# Patient Record
Sex: Male | Born: 1965 | Race: White | Hispanic: No | Marital: Single | Smoking: Current every day smoker
Health system: Southern US, Community
[De-identification: ages and names within clinical notes are randomized; demographics above are authoritative.]

## PROBLEM LIST (undated history)

## (undated) HISTORY — PX: NECK SURGERY: SHX720

---

## 2007-02-27 ENCOUNTER — Ambulatory Visit (HOSPITAL_COMMUNITY): Admission: RE | Admit: 2007-02-27 | Discharge: 2007-02-28 | Payer: Self-pay | Admitting: Neurosurgery

## 2008-07-05 ENCOUNTER — Observation Stay (HOSPITAL_COMMUNITY): Admission: RE | Admit: 2008-07-05 | Discharge: 2008-07-06 | Payer: Self-pay | Admitting: Neurosurgery

## 2008-08-06 ENCOUNTER — Encounter: Admission: RE | Admit: 2008-08-06 | Discharge: 2008-08-06 | Payer: Self-pay | Admitting: Neurosurgery

## 2008-09-27 ENCOUNTER — Encounter: Admission: RE | Admit: 2008-09-27 | Discharge: 2008-09-27 | Payer: Self-pay | Admitting: Neurosurgery

## 2008-10-29 ENCOUNTER — Encounter: Admission: RE | Admit: 2008-10-29 | Discharge: 2008-10-29 | Payer: Self-pay | Admitting: Neurosurgery

## 2011-03-02 NOTE — Op Note (Signed)
Craig Faulkner, Craig Faulkner                 ACCOUNT NO.:  [id number]   MEDICAL RECORD NO.:  [id number]          PATIENT TYPE:  OBV   LOCATION:  3599                         FACILITY:  [facility]   PHYSICIAN:  Donalee Citrin, M.D.        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   PROCEDURE:  Anterior cervical diskectomy and fusion at C6-7, exploration  of fusion at C5-6, and removal of hardware at C5-6.   SURGEON:  Donalee Citrin, MD   ASSISTANT:  Reinaldo Meeker, MD   ANESTHESIA:  General endotracheal.   HISTORY OF PRESENT ILLNESS:  The patient is a very pleasant 45 year old  gentleman who underwent a C5-6 ACDF about a year and a half ago.  Over  the last several months, the patient had progressive worsening neck and  right arm pain radiating down the first two fingers of his right hand.  MRI scan showed disk herniation at C6-7 causing spinal cord compression  and a right C7 neural foraminal stenosis.  The patient failed all forms  of conservative treatment.  X-rays of his C5-6 fusion appeared to be  solid.  However, the outline of the space was still able to be  visualized even though this did not have any halo around it and the  screws appeared to be solid, however, the patient was recommended an  ACDF at C6-7 and exploration of fusion at C5-6.  The risks and benefits  were explained to the patient.  He understood and agreed to proceed  forward.   PROCEDURE IN DETAIL:  The patient was brought to the OR, was induced  under general anesthesia, positioned supine, the neck flexed in slight  extension in 5 pounds of halter traction.  The right side of his neck  was prepped and draped in the usual sterile fashion.  Preoperative x-ray  confirmed that the old incision would be adequate for  exposure of both  levels and so his old incision was incised.  Scar tissue was dissected  off and the platysma was divided longitudinally and the avascular plane  between the sternocleidomastoid and strap muscle was developed down to  the prevertebral fascia.  Prevertebral fascia and scar were dissected  off of the C6-7 disk space as well as off the C5-6 plate.  The Venture  plate was then visualized.  The screw holes were freed up and the screws  were removed and the plate was removed.  The Bovie electrocautery was  used to clean off the anterior vertebral bodies at C5-6 and the bone  graft.  The anterior margin of the bone graft again appeared to be  solid; however, on x-ray it was decided at that point just to put a 2-  level plate in without actually dislodging the bone graft as it did  appear to be solid.  I did not see any breaks between the anterior  margin of the bone graft in either C5 or C6 vertebral bodies.  An  annulotomy was then made at C6-7.  Disk space was cleaned out at C6-7  with a high-speed drill down the posterior annulus and osteophytic  complex.  Using a 1-mm Kerrison punch, the undersurface of the endplates  were aggressively under bitten.  The PLL was identified and removed in  piecemeal fashion.  Several large fractured disk herniations were  immediately expressed and removed in piecemeal fashion.  Aggressive  under biting of both the C6 and C7 endplates to the level of the C7  pedicles bilaterally was carried out.  Both C7 nerve roots were  identified, and both C7 nerve roots were skeletonized and flushed with a  pedicle.  At the end of the diskectomy, there was no further stenosis in  the central canal or either the C7 nerve root.  There was a fair amount  of soft disk material displacing the spinal cord in right C7 nerve root,  it was all removed in piecemeal fashion.  Then, the endplates were  scraped.  A size 9-mm bone graft was then inserted at C6-7.  A  45-mm  Venture plate was then placed.  Two rescue screws were placed in the  body of C5.  Then, two new holes were drilled in the C7 and two new  holes were also drilled in the C6.  A 15-mm variable angle screws were  placed at C6-C7.  Intraoperative fluoroscopy confirmed good position of  the plate, screws, and bone grafts.  Then, the wound was copiously  irrigated.  Meticulous hemostasis was maintained.  The platysma was  reapproximate with interrupted Vicryl, and the skin was closed with  running 4-0 subcuticular.  Benzoin and Steri-Strips were applied.  The  patient went to the recovery room in stable condition.  At the end of  the case, sponge, needle, and instrument counts were correct.           ______________________________  Donalee Citrin, M.D.     GC/MEDQ  D:  07/05/2008  T:  07/06/2008  Job:  [id number]

## 2011-03-05 NOTE — Op Note (Signed)
NAMEKLAUS, CASTENEDA                 ACCOUNT NO.:  [id number]   MEDICAL RECORD NO.:  [id number]          PATIENT TYPE:  AMB   LOCATION:  SDS                          FACILITY:  [facility]   PHYSICIAN:  Donalee Citrin, M.D.        DATE OF BIRTH:  11/07/1965   DATE OF PROCEDURE:  02/27/2007  DATE OF DISCHARGE:                               OPERATIVE REPORT   PREOPERATIVE DIAGNOSIS:  C6 radiculopathy, right from cervical  spondylosis with ruptured disk at C5-C6.   PROCEDURE:  Intracervical diskectomy and fusion C5-C6 using an 8 mm  allograft wedge and 25 mm Venture plate with four 13-mm screws.   SURGEON:  Donalee Citrin, M.D.   ASSISTANT:  Kathaleen Maser. Pool, M.D.   ANESTHESIA:  General endotracheal.   HISTORY OF PRESENT ILLNESS:  The patient is a very pleasant 45 year old  gentleman who has longstanding neck and right arm pain with trace  weakness of his right triceps.  MRI scan showed severe cervical spinal  stenosis with a ruptured disk causing spinal cord compression, right  foraminal stenosis at C5-C6 level on the C6 nerve root. The patient  failed all conservative treatment.  It was recommended __________ .  The  risks, benefits of the operation were explained to the patient and  consent.  The patient understood and agreed to the procedure.   DESCRIPTION OF PROCEDURE:  The patient was brought to the OR, induced  under general anesthesia and placed supine.  The neck was prepped and  draped in routine sterile fashion.  Preoperative x-ray localized at the  appropriate level and a curvilinear incision was made just off midline  longitudinally.  The __________  prevertebral fascia.  Prevertebral  fascia dissected with Kitners.  Intraoperative x-ray for mobilization of  the appropriate level.  Then the __________  15 blade scalpel __________  retractor was placed.  A large __________  vetebral body was bitten off  with a Kerrison punch and drill and the interspace was drilled down with  a high-speed  drill to the posterior annulus and posterior __________  complexes.  At this point the __________  brought in the field and  __________  drilled down further to the posterior annulus __________  ligament.  Then using 1 mm Kerrison punch the under surface of the  posterior __________  ligament and annulus were removed in piecemeal  fashion exposing the thecal sac.  Then marching from left to right the  undersurface of both end plates were under bitten and decompressed the  canal, there was a  large ruptured disk displaced on the right side of  the spinal cord and right proximal C6 neural foramen.  This was all  removed in a piecemeal fashion with Kerrison punch, exposing the C6  nerve root, flushing the pedicle with C6.  The foramen was then widely  decompressed and the C6 nerve root was explored, noted to be widely  decompressed and the foramen patent.  Then the remainder of the 5  vertebral body was under bitten again, marching across the left C6  pedicle and left C6  nerve root.  After this was adequately decompressed,  the wound was copiously irrigated __________  maintained.  A size 8 mm  graft was inserted after scraping the end plates.  __________  deep to  the anterior vetebral line.  Then a __________  plate measuring 25 mm  was __________  screws were all placed with excellent purchase.  The  locking mechanism was engaged.  The wound was copiously  irrigated with __________  maintained.  The platysmas reapproximated  with interrupted Vicryl and the skin was closed in a running 4-0  subcuticular.  Benzoin and Steri-Strips were applied.  The patient went  to recovery stable.  Sponge and needle count correct.           ______________________________  Donalee Citrin, M.D.     GC/MEDQ  D:  02/27/2007  T:  02/27/2007  Job:  [id number]

## 2011-07-19 LAB — BASIC METABOLIC PANEL
BUN: 14
CO2: 25
Chloride: 102
GFR calc Af Amer: >60
GFR calc non Af Amer: >60
Glucose, Bld: 88
Potassium: 4.6

## 2011-07-19 LAB — CBC
HCT: 47.5
Hemoglobin: 16.3
MCV: 95.4
WBC: 11 — ABNORMAL HIGH

## 2011-07-19 LAB — HEPATIC FUNCTION PANEL
Alkaline Phosphatase: 60
Bilirubin, Direct: 0.2
Total Bilirubin: 1.1
Total Protein: 6.9

## 2014-07-06 ENCOUNTER — Emergency Department (HOSPITAL_COMMUNITY)
Admission: EM | Admit: 2014-07-06 | Discharge: 2014-07-06 | Disposition: A | Discharge status: Home / Self Care | Payer: Self-pay | Attending: Emergency Medicine | Admitting: Emergency Medicine

## 2014-07-06 ENCOUNTER — Encounter (HOSPITAL_COMMUNITY): Payer: Self-pay | Admitting: Emergency Medicine

## 2014-07-06 ENCOUNTER — Emergency Department (HOSPITAL_COMMUNITY): Payer: No Typology Code available for payment source

## 2014-07-06 DIAGNOSIS — F17200 Nicotine dependence, unspecified, uncomplicated: Secondary | ICD-10-CM | POA: Insufficient documentation

## 2014-07-06 DIAGNOSIS — Z9889 Other specified postprocedural states: Secondary | ICD-10-CM | POA: Insufficient documentation

## 2014-07-06 DIAGNOSIS — Y9389 Activity, other specified: Secondary | ICD-10-CM | POA: Insufficient documentation

## 2014-07-06 DIAGNOSIS — S0993XA Unspecified injury of face, initial encounter: Secondary | ICD-10-CM | POA: Insufficient documentation

## 2014-07-06 DIAGNOSIS — Z880 Allergy status to penicillin: Secondary | ICD-10-CM | POA: Insufficient documentation

## 2014-07-06 DIAGNOSIS — S161XXA Strain of muscle, fascia and tendon at neck level, initial encounter: Secondary | ICD-10-CM

## 2014-07-06 DIAGNOSIS — S199XXA Unspecified injury of neck, initial encounter: Secondary | ICD-10-CM

## 2014-07-06 DIAGNOSIS — S139XXA Sprain of joints and ligaments of unspecified parts of neck, initial encounter: Secondary | ICD-10-CM | POA: Insufficient documentation

## 2014-07-06 DIAGNOSIS — Y9289 Other specified places as the place of occurrence of the external cause: Secondary | ICD-10-CM | POA: Insufficient documentation

## 2014-07-06 MED ORDER — OXYCODONE-ACETAMINOPHEN 5-325 MG PO TABS
2.0000 | ORAL_TABLET | Freq: Once | ORAL | Status: AC
  Administered 2014-07-06: 2 via ORAL
  Filled 2014-07-06: qty 2

## 2014-07-06 MED ORDER — OXYCODONE-ACETAMINOPHEN 5-325 MG PO TABS: 1.0000 | ORAL_TABLET | Freq: Four times a day (QID) | ORAL | Status: AC | PRN
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
## 2014-07-06 NOTE — ED Notes (Signed)
Pt placed on monitor. Pt monitored by blood pressure and pulse ox.  

## 2014-07-06 NOTE — Discharge Instructions (Signed)
You have neck pain, possibly from a cervical strain and/or pinched nerve.  ° °SEEK IMMEDIATE MEDICAL ATTENTION IF: °You develop difficulties swallowing or breathing.  °You have new or worse numbness, weakness, tingling, or movement problems in your arms or legs.  °You develop increasing pain which is uncontrolled with medications.  °You have change in bowel or bladder function, or other concerns. ° ° ° °

## 2016-01-05 ENCOUNTER — Encounter (HOSPITAL_COMMUNITY): Payer: Self-pay

## 2016-01-05 ENCOUNTER — Emergency Department (HOSPITAL_COMMUNITY)
Admission: EM | Admit: 2016-01-05 | Discharge: 2016-01-05 | Disposition: A | Discharge status: Home / Self Care | Payer: No Typology Code available for payment source | Attending: Emergency Medicine | Admitting: Emergency Medicine

## 2016-01-05 ENCOUNTER — Emergency Department (HOSPITAL_COMMUNITY): Payer: No Typology Code available for payment source

## 2016-01-05 DIAGNOSIS — R202 Paresthesia of skin: Secondary | ICD-10-CM | POA: Insufficient documentation

## 2016-01-05 DIAGNOSIS — S199XXA Unspecified injury of neck, initial encounter: Secondary | ICD-10-CM | POA: Insufficient documentation

## 2016-01-05 DIAGNOSIS — Y9389 Activity, other specified: Secondary | ICD-10-CM | POA: Insufficient documentation

## 2016-01-05 DIAGNOSIS — S0990XA Unspecified injury of head, initial encounter: Secondary | ICD-10-CM | POA: Insufficient documentation

## 2016-01-05 DIAGNOSIS — F17210 Nicotine dependence, cigarettes, uncomplicated: Secondary | ICD-10-CM | POA: Insufficient documentation

## 2016-01-05 DIAGNOSIS — Y998 Other external cause status: Secondary | ICD-10-CM | POA: Insufficient documentation

## 2016-01-05 DIAGNOSIS — Z5321 Procedure and treatment not carried out due to patient leaving prior to being seen by health care provider: Secondary | ICD-10-CM | POA: Insufficient documentation

## 2016-01-05 DIAGNOSIS — W5519XA Other contact with horse, initial encounter: Secondary | ICD-10-CM | POA: Insufficient documentation

## 2016-01-05 DIAGNOSIS — Y9289 Other specified places as the place of occurrence of the external cause: Secondary | ICD-10-CM | POA: Insufficient documentation

## 2016-01-05 NOTE — ED Notes (Signed)
Pt. Was breaking in a horse and the horse and him both went onto the ground.  The horse fell on top of the pt.  He was okay at the beginning, but now his head is hurting and is having severe neck pain.  Skin is warm and dry.  Pt. Is having tingling  In is shoulders.  He thinks he possibly had LOC.   Skin is warm and dry.  He is unable to move his neck, severe pain if he does.  C-collar applied in Triage.  Denies any other injuries.  Pt. Has had 2 surgeries on his neck.

## 2016-01-07 ENCOUNTER — Encounter (HOSPITAL_COMMUNITY): Payer: Self-pay | Admitting: Emergency Medicine

## 2016-01-07 ENCOUNTER — Emergency Department (HOSPITAL_COMMUNITY)
Admission: EM | Admit: 2016-01-07 | Discharge: 2016-01-07 | Disposition: A | Discharge status: Home / Self Care | Payer: No Typology Code available for payment source | Attending: Emergency Medicine | Admitting: Emergency Medicine

## 2016-01-07 DIAGNOSIS — S161XXA Strain of muscle, fascia and tendon at neck level, initial encounter: Secondary | ICD-10-CM | POA: Insufficient documentation

## 2016-01-07 DIAGNOSIS — Z880 Allergy status to penicillin: Secondary | ICD-10-CM | POA: Insufficient documentation

## 2016-01-07 DIAGNOSIS — F17210 Nicotine dependence, cigarettes, uncomplicated: Secondary | ICD-10-CM | POA: Insufficient documentation

## 2016-01-07 DIAGNOSIS — W1839XA Other fall on same level, initial encounter: Secondary | ICD-10-CM | POA: Insufficient documentation

## 2016-01-07 DIAGNOSIS — W19XXXA Unspecified fall, initial encounter: Secondary | ICD-10-CM

## 2016-01-07 DIAGNOSIS — Y9389 Activity, other specified: Secondary | ICD-10-CM | POA: Insufficient documentation

## 2016-01-07 DIAGNOSIS — Y9289 Other specified places as the place of occurrence of the external cause: Secondary | ICD-10-CM | POA: Insufficient documentation

## 2016-01-07 DIAGNOSIS — Y998 Other external cause status: Secondary | ICD-10-CM | POA: Insufficient documentation

## 2016-01-07 MED ORDER — CYCLOBENZAPRINE HCL 10 MG PO TABS: 10.0000 mg | ORAL_TABLET | Freq: Two times a day (BID) | ORAL | Status: AC | PRN

## 2016-01-07 MED ORDER — METHOCARBAMOL 500 MG PO TABS: 500.0000 mg | ORAL_TABLET | Freq: Two times a day (BID) | ORAL | Status: AC

## 2016-01-07 NOTE — ED Notes (Signed)
Pt. fell from his horse Monday morning , reports pain at posterior neck and upper back ,CT scan of head and c-spine done here at 2 days ago , pain increases with movement unrelieved by OTC pain medications .

## 2016-01-07 NOTE — ED Provider Notes (Signed)
Patient left without being seen. Was notified by nurse in triage that patient had a horse roll ontop of her after hitting her head.  Complaining of head and neck pain.  CT ordered based on history as there was a prolonged wait to be seen. CT negative.   1. Patient left without being seen      Melene Planan Manish Ruggiero, DO 01/07/16 1925

## 2016-01-07 NOTE — Discharge Instructions (Signed)

## 2016-01-07 NOTE — ED Provider Notes (Signed)
CSN: [id number]     Arrival date & time 01/07/16  4098 History   First MD Initiated Contact with Patient 01/07/16 [phone]     Chief Complaint  Patient presents with  . Fall     (Consider location/radiation/quality/duration/timing/severity/associated sxs/prior Treatment) Patient is a 50 y.o. male presenting with fall. The history is provided by the patient. No language interpreter was used.  Fall This is a new problem. The current episode started in the past 7 days. The problem occurs constantly. The problem has been unchanged. Associated symptoms include neck pain. Pertinent negatives include no numbness, visual change or weakness. The symptoms are aggravated by twisting. He has tried acetaminophen and NSAIDs for the symptoms. The treatment provided mild relief.    History reviewed. No pertinent past medical history. Past Surgical History  Procedure Laterality Date  . Neck surgery     No family history on file. Social History  Substance Use Topics  . Smoking status: Current Every Day Smoker    Types: Cigarettes  . Smokeless tobacco: None  . Alcohol Use: No    Review of Systems  Musculoskeletal: Positive for neck pain.  Neurological: Negative for weakness and numbness.  All other systems reviewed and are negative.     Allergies  Codeine and Penicillins  Home Medications   Prior to Admission medications   Medication Sig Start Date End Date Taking? Authorizing Provider  oxyCODONE-acetaminophen (PERCOCET) 5-325 MG per tablet Take 1-2 tablets by mouth every 6 (six) hours as needed for severe pain. 07/06/14   Wayland Salinas, MD   BP 138/101 mmHg  Pulse 83  Temp(Src) 97.8 F (36.6 C) (Oral)  Resp 16  Ht  (1.778 m)  Wt 87.998 kg  BMI 27.84 kg/m2  SpO2 100% Physical Exam  Constitutional: He is oriented to person, place, and time. He appears well-developed and well-nourished.  Cardiovascular: Normal rate and regular rhythm.   Pulmonary/Chest: Effort normal and breath  sounds normal.  Musculoskeletal: Normal range of motion.  Cervical spine and paraspinal tenderness. No step off or deformity noted. Pt has full rom. Equal grip strength bilaterally  Neurological: He is alert and oriented to person, place, and time. He exhibits normal muscle tone. Coordination normal.  Skin: Skin is warm and dry.  Nursing note and vitals reviewed.   ED Course  Procedures (including critical care time) Labs Review Labs Reviewed - No data to display  Imaging Review Ct Head Wo Contrast  01/05/2016  CLINICAL DATA:  Larey Seat from horse.  Headache and neck pain EXAM: CT HEAD WITHOUT CONTRAST CT CERVICAL SPINE WITHOUT CONTRAST TECHNIQUE: Multidetector CT imaging of the head and cervical spine was performed following the standard protocol without intravenous contrast. Multiplanar CT image reconstructions of the cervical spine were also generated. COMPARISON:  09/04/2014 FINDINGS: CT HEAD FINDINGS Ventricle size is normal. Negative for acute or chronic infarction. Negative for hemorrhage or fluid collection. Negative for mass or edema. No shift of the midline structures. Calvarium is intact. CT CERVICAL SPINE FINDINGS ACDF with solid fusion C5-6 and C6-7. Normal alignment. No fracture. Mild disc degeneration and spurring at C3-4. IMPRESSION: Negative CT of the head Negative for cervical spine fracture.  Solid fusion C5-6 and C6-7. Electronically Signed   By: Marlan Palau M.D.   On: 01/05/2016 14:19   Ct Cervical Spine Wo Contrast  01/05/2016  CLINICAL DATA:  Larey Seat from horse.  Headache and neck pain EXAM: CT HEAD WITHOUT CONTRAST CT CERVICAL SPINE WITHOUT CONTRAST TECHNIQUE: Multidetector CT imaging of  the head and cervical spine was performed following the standard protocol without intravenous contrast. Multiplanar CT image reconstructions of the cervical spine were also generated. COMPARISON:  09/04/2014 FINDINGS: CT HEAD FINDINGS Ventricle size is normal. Negative for acute or chronic  infarction. Negative for hemorrhage or fluid collection. Negative for mass or edema. No shift of the midline structures. Calvarium is intact. CT CERVICAL SPINE FINDINGS ACDF with solid fusion C5-6 and C6-7. Normal alignment. No fracture. Mild disc degeneration and spurring at C3-4. IMPRESSION: Negative CT of the head Negative for cervical spine fracture.  Solid fusion C5-6 and C6-7. Electronically Signed   By: Marlan Palauharles  Treaster M.D.   On: 01/05/2016 14:19   I have personally reviewed and evaluated these images and lab results as part of my medical decision-making.   EKG Interpretation None      MDM   Final diagnoses:  Cervical strain, initial encounter  Fall, initial encounter    Pt was in the er 2 days ago and had ct of head and neck that had no acute finding. He left before being seen by a provider. Will treat with muscle relaxer. No red flag symptoms    Teressa LowerVrinda Timmie Dugue, NP 01/07/16 [id number]  Blane OharaJoshua Zavitz, MD 01/07/16 1011

## 2016-06-21 ENCOUNTER — Emergency Department (HOSPITAL_COMMUNITY)
Admission: EM | Admit: 2016-06-21 | Discharge: 2016-06-21 | Disposition: A | Discharge status: Home / Self Care | Payer: Self-pay | Attending: Emergency Medicine | Admitting: Emergency Medicine

## 2016-06-21 ENCOUNTER — Encounter (HOSPITAL_COMMUNITY): Payer: Self-pay

## 2016-06-21 DIAGNOSIS — Y9389 Activity, other specified: Secondary | ICD-10-CM | POA: Insufficient documentation

## 2016-06-21 DIAGNOSIS — F17210 Nicotine dependence, cigarettes, uncomplicated: Secondary | ICD-10-CM | POA: Insufficient documentation

## 2016-06-21 DIAGNOSIS — S60450A Superficial foreign body of right index finger, initial encounter: Secondary | ICD-10-CM | POA: Insufficient documentation

## 2016-06-21 DIAGNOSIS — W458XXA Other foreign body or object entering through skin, initial encounter: Secondary | ICD-10-CM | POA: Insufficient documentation

## 2016-06-21 DIAGNOSIS — M795 Residual foreign body in soft tissue: Secondary | ICD-10-CM

## 2016-06-21 DIAGNOSIS — Y999 Unspecified external cause status: Secondary | ICD-10-CM | POA: Insufficient documentation

## 2016-06-21 DIAGNOSIS — Z23 Encounter for immunization: Secondary | ICD-10-CM | POA: Insufficient documentation

## 2016-06-21 DIAGNOSIS — Y929 Unspecified place or not applicable: Secondary | ICD-10-CM | POA: Insufficient documentation

## 2016-06-21 MED ORDER — LIDOCAINE HCL (PF) 1 % IJ SOLN
5.0000 mL | Freq: Once | INTRAMUSCULAR | Status: AC
  Administered 2016-06-21: 5 mL
  Filled 2016-06-21: qty 5

## 2016-06-21 MED ORDER — IBUPROFEN 200 MG PO TABS
600.0000 mg | ORAL_TABLET | Freq: Once | ORAL | Status: AC
  Administered 2016-06-21: 600 mg via ORAL
  Filled 2016-06-21: qty 1

## 2016-06-21 MED ORDER — TETANUS-DIPHTH-ACELL PERTUSSIS 5-2.5-18.5 LF-MCG/0.5 IM SUSP
0.5000 mL | Freq: Once | INTRAMUSCULAR | Status: AC
  Administered 2016-06-21: 0.5 mL via INTRAMUSCULAR
  Filled 2016-06-21: qty 0.5

## 2016-06-21 NOTE — ED Notes (Signed)
Declined W/C at D/C and was escorted to lobby by RN. 

## 2016-06-21 NOTE — ED Triage Notes (Signed)
Patient here with fish hook stuck in right hand index finger, no bleeding

## 2016-06-21 NOTE — Discharge Instructions (Signed)
Your tetanus was updated today. Keep area clean with warm water and soap. Keep covered with antibiotic ointment and bandage until scab forms. Return to the ER for new or worsening symptoms.

## 2016-06-21 NOTE — ED Provider Notes (Signed)
[facility]-EMERGENCY DEPT Provider Note   CSN: [id number] Arrival date & time: 06/21/16  1321    By signing my name below, I, Craig Faulkner, attest that this documentation has been prepared under the direction and in the presence of [name], [place]. Electronically Signed: Sonum Faulkner, Neurosurgeon. 06/21/16. 1:45 PM.  History   Chief Complaint No chief complaint on file.   The history is provided by the patient. No language interpreter was used.     HPI Comments: Craig Faulkner is a 50 y.o. male who presents to the Emergency Department complaining of a fish hook to the right index finger that occurred PTA. Patient states he was attempting to catch a fishing rod when the injury occurred. He has associated pain to the affected area which is worse with applied pressure or movement of the hook. He reports cleaning the injury and applying an anti-bacterial ointment. He denies numbness. He is unsure of last tetanus update.   History reviewed. No pertinent past medical history.  There are no active problems to display for this patient.   Past Surgical History:  Procedure Laterality Date  . NECK SURGERY         Home Medications    Prior to Admission medications   Medication Sig Start Date End Date Taking? Authorizing Provider  cyclobenzaprine (FLEXERIL) 10 MG tablet Take 1 tablet (10 mg total) by mouth 2 (two) times daily as needed for muscle spasms. 01/07/16   Teressa Lower, NP  methocarbamol (ROBAXIN) 500 MG tablet Take 1 tablet (500 mg total) by mouth 2 (two) times daily. 01/07/16   Teressa Lower, NP  oxyCODONE-acetaminophen (PERCOCET) 5-325 MG per tablet Take 1-2 tablets by mouth every 6 (six) hours as needed for severe pain. 07/06/14   Wayland Salinas, MD    Family History No family history on file.  Social History Social History  Substance Use Topics  . Smoking status: Current Every Day Smoker    Types: Cigarettes  . Smokeless tobacco: Never Used  . Alcohol use No     Allergies     Codeine and Penicillins   Review of Systems Review of Systems  10 Systems reviewed and all are negative for acute change except as noted in the HPI.   Physical Exam Updated Vital Signs BP 146/95 (BP Location: Right Arm)   Pulse 85   Temp 98.3 F (36.8 C) (Oral)   Resp 20   SpO2 97%   Physical Exam  Constitutional: He is oriented to person, place, and time. He appears well-developed and well-nourished.  HENT:  Head: Normocephalic and atraumatic.  Cardiovascular: Normal rate.   Pulmonary/Chest: Effort normal.  Neurological: He is alert and oriented to person, place, and time.  Skin: Skin is warm and dry.  3 prong barbed fishing hook with 1 prong lodged in radial distal right index fingertip. Appears to be lodged deeply. Hook tip cannot be palpated. Finger sensation and cap refill intact.   Psychiatric: He has a normal mood and affect.  Nursing note and vitals reviewed.    ED Treatments / Results  DIAGNOSTIC STUDIES: Oxygen Saturation is 97% on RA, adequate by my interpretation.    COORDINATION OF CARE: 1:45 PM Discussed treatment plan with pt at bedside and pt agreed to plan.   Labs (all labs ordered are listed, but only abnormal results are displayed) Labs Reviewed - No data to display  EKG  EKG Interpretation None       Radiology No results found.  Procedures .Foreign Body  Removal Date/Time: 06/21/2016 2:26 PM Performed by: Carlene CoriaSAM, Daphine Loch Y Authorized by: Carlene CoriaSAM, Kamesha Herne Y  Consent: Verbal consent obtained. Risks and benefits: risks, benefits and alternatives were discussed Consent given by: patient Patient understanding: patient states understanding of the procedure being performed Patient consent: the patient's understanding of the procedure matches consent given Procedure consent: procedure consent matches procedure scheduled Relevant documents: relevant documents present and verified Site marked: the operative site was marked Required items: required  blood products, implants, devices, and special equipment available Patient identity confirmed: verbally with patient Time out: Immediately prior to procedure a "time out" was called to verify the correct patient, procedure, equipment, support staff and site/side marked as required. Body area: skin General location: upper extremity Location details: right index finger Anesthesia: nerve block  Anesthesia: Local Anesthetic: lidocaine 1% without epinephrine Anesthetic total: 5 mL  Sedation: Patient sedated: no Patient restrained: no Patient cooperative: yes Localization method: visualized Removal mechanism: forceps and scalpel (tiny incision made to facilitate backing out of barbed hook) Dressing: antibiotic ointment and dressing applied Tendon involvement: none Depth: subcutaneous Complexity: simple Patient tolerance: Patient tolerated the procedure well with no immediate complications .Nerve Block Date/Time: 06/21/2016 2:30 PM Performed by: Carlene CoriaSAM, Delores Thelen Y Authorized by: Carlene CoriaSAM, Shabazz Mckey Y   Consent:    Consent obtained:  Verbal   Consent given by:  Patient Indications:    Indications:  Procedural anesthesia Location:    Body area:  Upper extremity   Upper extremity nerve blocked: index finger.   Laterality:  Right Pre-procedure details:    Skin preparation:  Povidone-iodine   Preparation: Patient was prepped and draped in usual sterile fashion   Procedure details (see MAR for exact dosages):    Block needle gauge:  25 G   Anesthetic injected:  Lidocaine 1% w/o epi   Steroid injected:  None   Additive injected:  None   Injection procedure:  Anatomic landmarks identified, anatomic landmarks palpated, incremental injection, introduced needle and negative aspiration for blood Post-procedure details:    Dressing:  Sterile dressing   Outcome:  Anesthesia achieved   Patient tolerance of procedure:  Tolerated well, no immediate complications   (including critical care  time)  Medications Ordered in ED Medications  ibuprofen (ADVIL,MOTRIN) tablet 600 mg (not administered)  lidocaine (PF) (XYLOCAINE) 1 % injection 5 mL (not administered)  Tdap (BOOSTRIX) injection 0.5 mL (not administered)     Initial Impression / Assessment and Plan / ED Course  I have reviewed the triage vital signs and the nursing notes.  Pertinent labs & imaging results that were available during my care of the patient were reviewed by me and considered in my medical decision making (see chart for details).  Clinical Course   Pt tolerated nerve block and hook removal well. Due to deep nature of hook embedment it was necessary to make a small incision to facilitate removal. Wound cleaned thoroughtly and tdap updated. ER return precautions given.  Final Clinical Impressions(s) / ED Diagnoses   Final diagnoses:  Fishing hook foreign body, initial encounter  Foreign body (FB) in soft tissue    New Prescriptions Discharge Medication List as of 06/21/2016  2:30 PM     I personally performed the services described in this documentation, which was scribed in my presence. The recorded information has been reviewed and is accurate.    Carlene CoriaSerena Y Keenon Leitzel, PA-C 06/22/16 [id number]    Lorre NickAnthony Allen, MD 06/22/16 [phone]

## 2016-11-18 IMAGING — CT CT CERVICAL SPINE W/O CM
3 of 6 series · 11 of 33 positions shown, 13 images · non-contrast
Comparison: 09/04/2014

CLINICAL DATA: Fell from horse.  Headache and neck pain

EXAM:
CT HEAD WITHOUT CONTRAST
CT CERVICAL SPINE WITHOUT CONTRAST
TECHNIQUE: Multidetector CT imaging of the head and cervical spine was
performed following the standard protocol without intravenous
contrast. Multiplanar CT image reconstructions of the cervical spine
were also generated.

[Series 304: axial · axial · 0.32mm/px · z∈[+151,+258]mm · 3 of 112 slices shown, 4 images]
[im 28/112  soft-tissue]
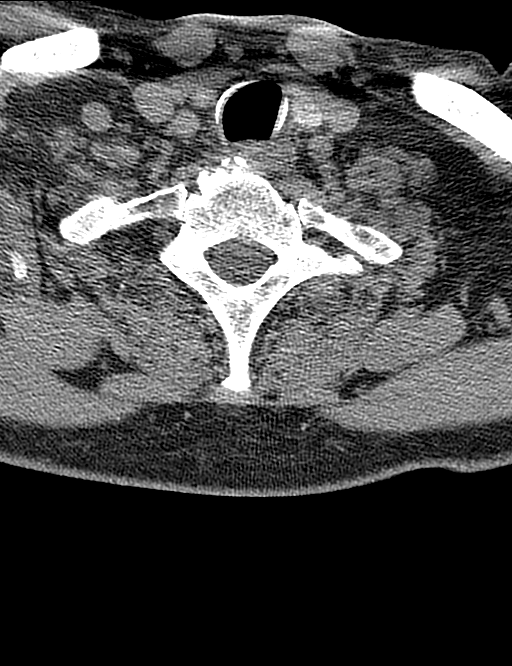
[im 28/112  bone]
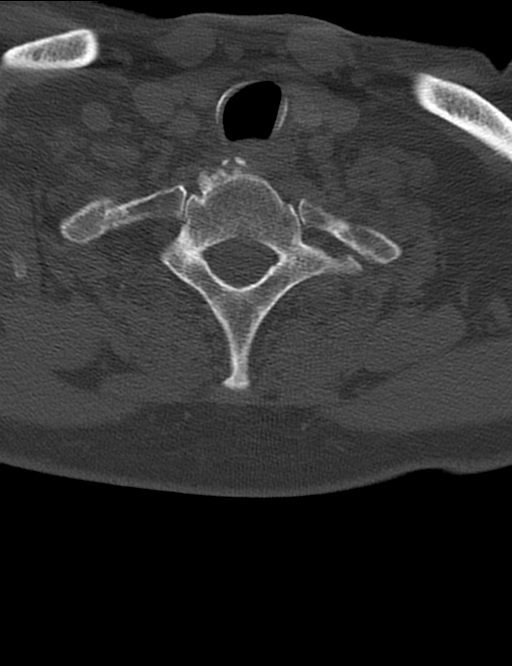
[im 56/112  bone]
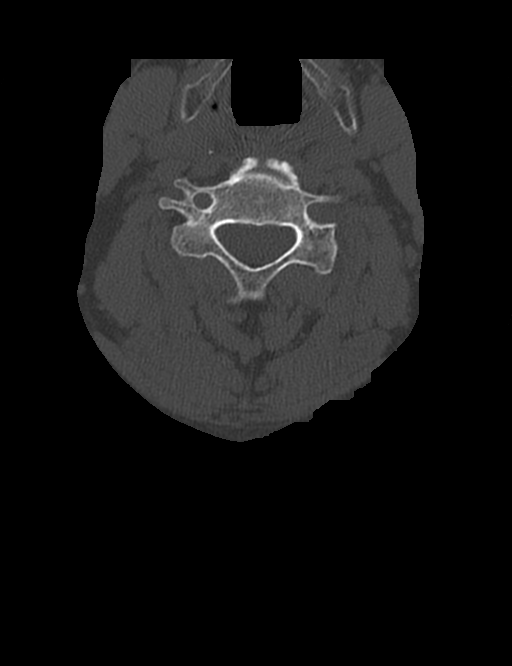
[im 84/112  bone]
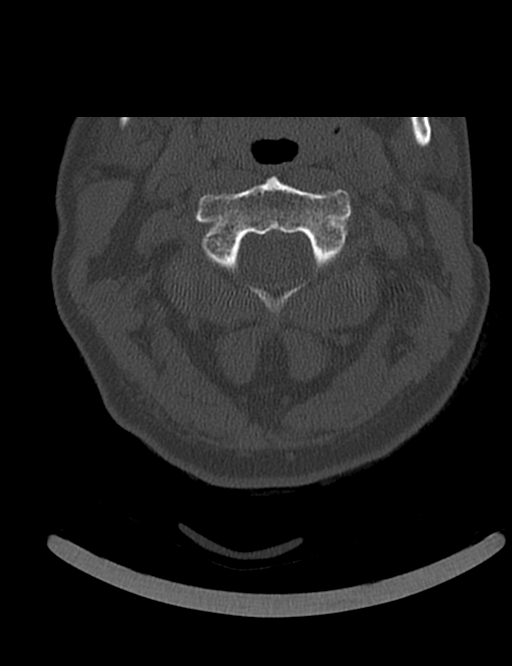

[Series 305: coronal · coronal · 0.32mm/px · 3 of 53 slices shown]
[im 11/53  bone]
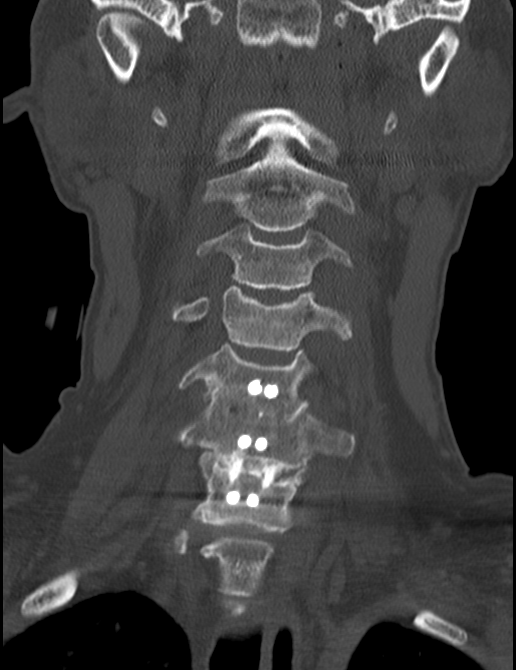
[im 21/53  bone]
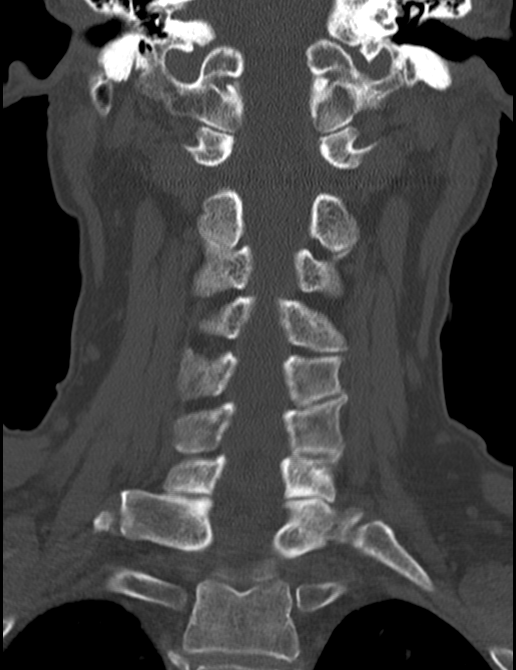
[im 32/53  bone]
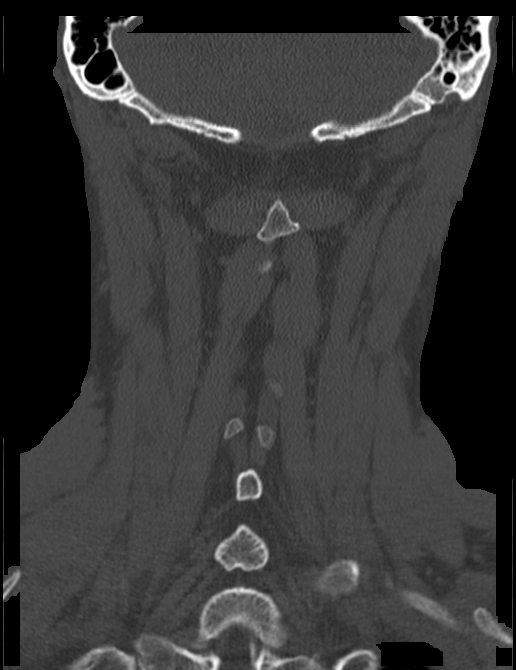

[Series 306: sagittal · sagittal · 0.32mm/px · 5 of 56 slices shown, 6 images]
[im 19/56  bone]
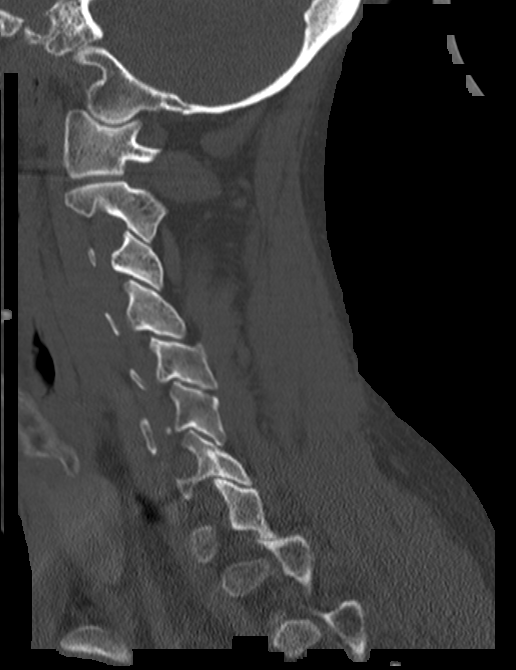
[im 23/56  bone]
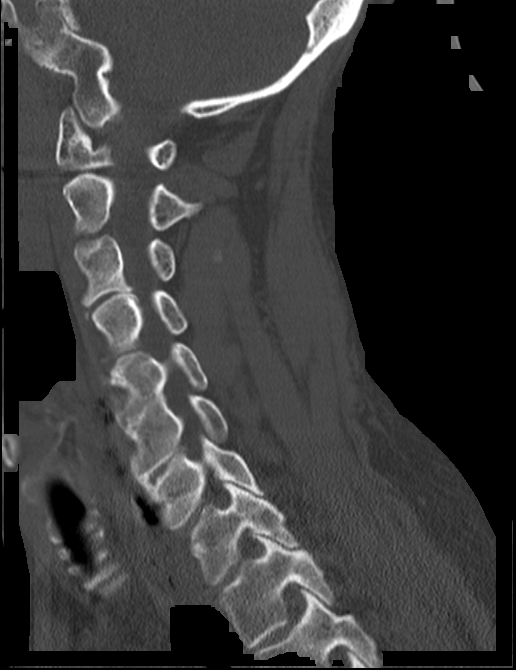
[im 28/56  soft-tissue]
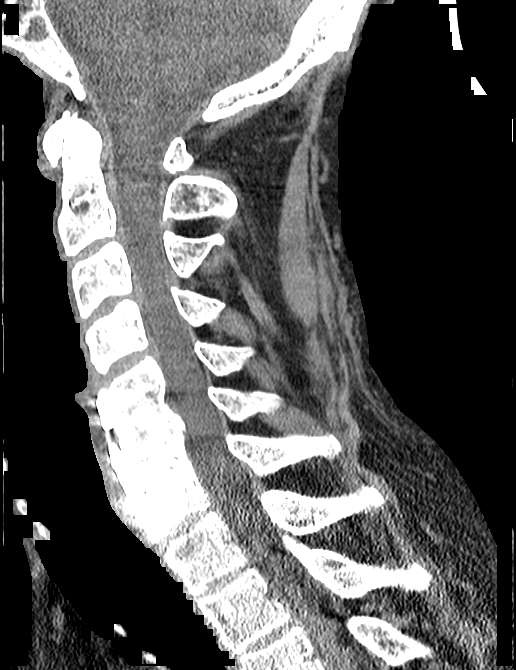
[im 28/56  bone]
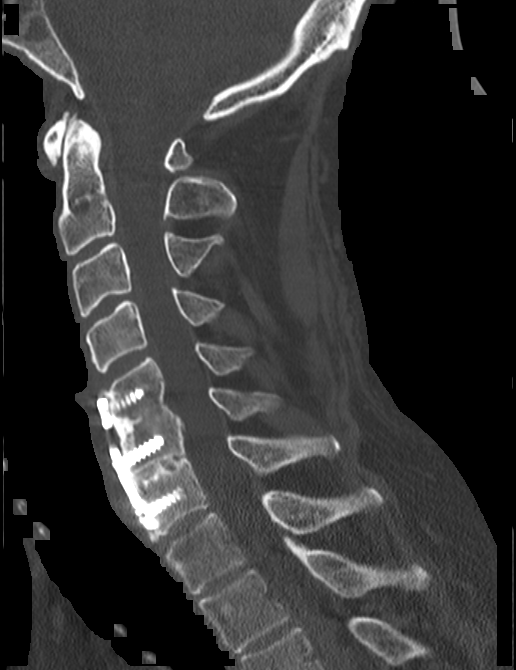
[im 33/56  bone]
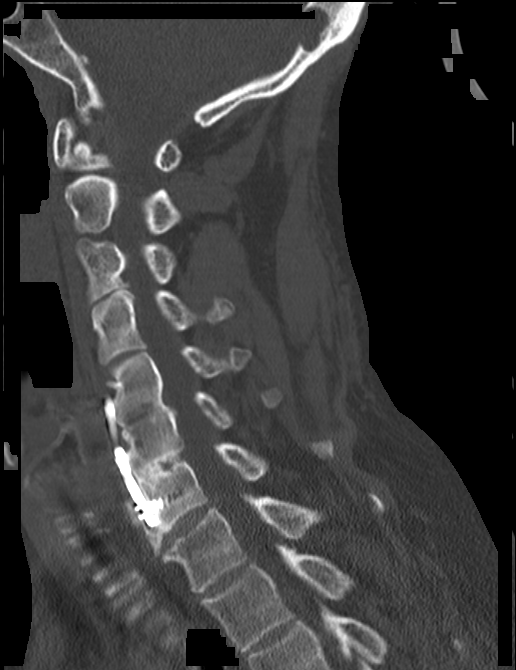
[im 37/56  bone]
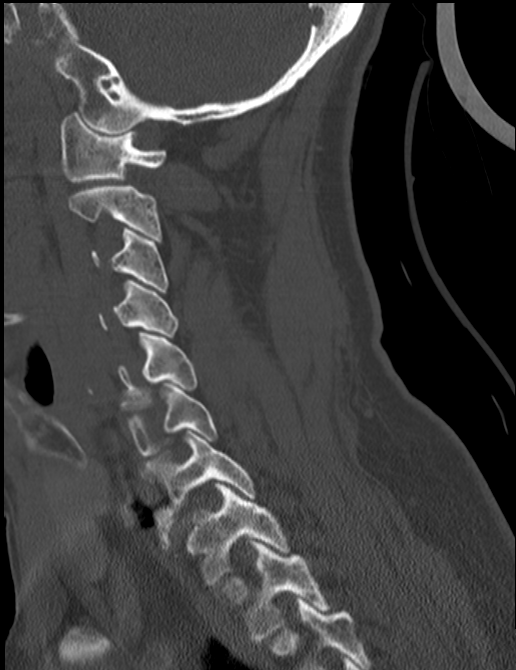

[11 of 33 positions shown; findings below may reference images not displayed]

FINDINGS: CT HEAD FINDINGS

Ventricle size is normal. Negative for acute or chronic infarction.
Negative for hemorrhage or fluid collection. Negative for mass or
edema. No shift of the midline structures.

Calvarium is intact.

CT CERVICAL SPINE FINDINGS

ACDF with solid fusion C5-6 and C6-7.

Normal alignment. No fracture. Mild disc degeneration and spurring
at C3-4.
IMPRESSION: Negative CT of the head

Negative for cervical spine fracture.  Solid fusion C5-6 and C6-7.
